# Patient Record
Sex: Male | Born: 1965 | Race: White | Hispanic: No | Marital: Single | Smoking: Never smoker
Health system: Southern US, Community
[De-identification: ages and names within clinical notes are randomized; demographics above are authoritative.]

---

## 2010-12-09 ENCOUNTER — Inpatient Hospital Stay (HOSPITAL_COMMUNITY)
Admission: RE | Admit: 2010-12-09 | Discharge: 2010-12-12 | DRG: 885 | Disposition: A | Discharge status: Home / Self Care | Payer: PRIVATE HEALTH INSURANCE | Source: Ambulatory Visit | Attending: Psychiatry | Admitting: Psychiatry

## 2010-12-09 DIAGNOSIS — F200 Paranoid schizophrenia: Principal | ICD-10-CM

## 2010-12-09 LAB — CBC
HCT: 49 % (ref 39.0–52.0)
Hemoglobin: 16.8 g/dL (ref 13.0–17.0)
MCH: 31.6 pg (ref 26.0–34.0)
MCV: 92.3 fL (ref 78.0–100.0)
RBC: 5.31 MIL/uL (ref 4.22–5.81)
RDW: 12.2 % (ref 11.5–15.5)
WBC: 7.8 10*3/uL (ref 4.0–10.5)

## 2010-12-09 LAB — COMPREHENSIVE METABOLIC PANEL
ALT: 20 U/L (ref 0–53)
AST: 22 U/L (ref 0–37)
Albumin: 3.8 g/dL (ref 3.5–5.2)
Calcium: 9.2 mg/dL (ref 8.4–10.5)
GFR calc Af Amer: >60 mL/min (ref >60)
Sodium: 139 mEq/L (ref 135–145)
Total Protein: 6.4 g/dL (ref 6.0–8.3)

## 2010-12-10 DIAGNOSIS — F29 Unspecified psychosis not due to a substance or known physiological condition: Secondary | ICD-10-CM

## 2010-12-10 NOTE — H&P (Signed)
NAMEDERRIOUS, BOLOGNA              ACCOUNT NO.:  [id number]  MEDICAL RECORD NO.:  [id number]           PATIENT TYPE:  I  LOCATION:  0505                          FACILITY:  [facility]  PHYSICIAN:  Marlis Edelson, DO        DATE OF BIRTH:  08-19-1966  DATE OF ADMISSION:  12/09/2010 DATE OF DISCHARGE:                      PSYCHIATRIC ADMISSION ASSESSMENT   This is a voluntary admission to the services of Dr. Huston Foley.  This 45- year-old single white male presented as a walk in yesterday.  He reported that he was homicidal and that he was experiencing psychosis. He stated that he has had psychosis on and off for about 20 years.  He came in because he was unsure that he might hurt others as he felt that they were calling him names.  He stated that he sees other mouths moving and calling him names such as loser and sick fool.  He believes that he is in a psychic loop of people liking and not liking him.  He also believes that other people think that he is the anti-Christ and Hitler. He denied command hallucinations per se.  He denied being suicidal and he is not currently on any medications.  PAST PSYCHIATRIC HISTORY:  He acknowledges being first diagnosed in the 90s.  This was up in [place] outpatient and recently he has been followed at the [facility].  He said the last antipsychotic they had him on was Seroquel but he did not like how it made him feel so he quit.  He was trying to be seen at [facility] and they referred him over here.  He does report having been committed to [facility] when he was in his 66s by his father.  Other than that, he denies any inpatient admissions.  SOCIAL HISTORY:  He is a high Garment/textile technologist.  He never married.  He has no children.  He is currently employed at [employer]. He is supposed to work Quarry manager and is fearful if he does not show up he will lose his job.  FAMILY HISTORY:  He states his mother was paranoid but she was  never medicated.  ALCOHOL AND DRUG HISTORY:  He states that he smokes marijuana every other day.  He has no primary care physician.  MEDICAL PROBLEMS:  He has none known.  He is not prescribed any medications at present.  LABORATORY STUDIES:  Showed he had no abnormalities of his CBC.  His metabolic profile showed a slightly elevated glucose at 110 and his TSH was normal at 2.918.  The remainder of his physical exam, he has really baggy eyes but he has not been sleeping well.  MENTAL STATUS EXAM:  Today he is alert and oriented.  He is appropriately groomed and dressed in his own clothing.  He appears to be at least adequately nourished.  His speech had no unusual references. He was not thought blocking or responding to internal stimuli.  His thought processes are fairly clear, rational, and goal oriented today. Judgment and insight are fair.  Concentration and memory superficially at least are intact.  Intelligence is average.  He denies being suicidal, he is not homicidal, and he says that the voices are a lot better today.  DIAGNOSES:  AXIS I:  Schizophrenia, paranoid type. AXIS II:  Deferred. AXIS III:  None known. AXIS IV:  Poor support, financial concerns. AXIS V:  40.  The patient was admitted for safety and stabilization.  He was started on Risperdal 0.5 mg p.o. b.i.d. for his psychosis, as well as trazodone 50 mg at h.s. for sleep.  As he was extremely paranoid, a do not admit was done and that can be reevaluated today.  He is requesting discharge so he will not lose his employment and Dr. Huston Foley will address that.     Mickie Leonarda Salon, P.A.-C.   ______________________________ Marlis Edelson, DO    MD/MEDQ  D:  12/10/2010  T:  12/10/2010  Job:  [id number]  Electronically Signed by Jaci Lazier ADAMS P.A.-C. on 12/10/2010 12:40:24 PM Electronically Signed by Marlis Edelson MD on 12/10/2010 07:34:30 PM

## 2010-12-28 NOTE — Discharge Summary (Signed)
  Kent Alvarado, Kent Alvarado              ACCOUNT NO.:  [id number]  MEDICAL RECORD NO.:  [id number]           PATIENT TYPE:  I  LOCATION:  0505                          FACILITY:  [facility]  PHYSICIAN:  Marlis Edelson, DO        DATE OF BIRTH:  1966-06-28  DATE OF ADMISSION:  12/09/2010 DATE OF DISCHARGE:  12/12/2010                              DISCHARGE SUMMARY   REASON FOR ADMISSION:  This was a 45 year old male that presented with symptoms of homicidal thoughts, experiencing psychotic symptoms, having a history of psychosis for about 20 years, was unsure about hurting others as he felt they were calling his name, seeing their mouths move and calling him names, such as loser and "sick fool."  FINAL DIAGNOSES:  Axis I:  Schizophrenia, paranoid type. Axis II:  Deferred. Axis III:  None. Axis IV:  Poor social support, burden of illness, financial issues. Axis V:  Global Assessment of Functioning is current is 50.  LABORATORY DATA:  Showed no abnormalities of the CBC.  Elevated glucose at 110.  TSH was 2.918.  SIGNIFICANT FINDINGS:  This is an alert and oriented patient, appropriately groomed, dressed in own clothing, appeared to be adequately nourished with no unusual references, not thought blocking or responding to internal stimuli.  His thought processes were fairly clear, rational and goal directed.  Judgment and insight were fair. Concentration and memory superficially intact.  Average intelligence. Denied any suicidal or homicidal thoughts and felt that the voices had lessened.  The patient was admitted to the unit for safety and stabilization.  He was started on Risperdal for psychosis and trazodone for sleep.  We contacted the patient's father.  He was discharged on Risperdal 1 mg in the morning and 2 mg at 5 p.m., trazodone 50 mg for sleep.  He had an appointment at the [facility] on Thursday, December 21, 2010.  The patient was advised to return to the emergency room for any  symptoms of suicidal or homicidal thinking or psychotic symptoms. The patient showed no signs of hypomania, mania or psychosis and was discharged.     Landry Corporal, N.P.   ______________________________ Marlis Edelson, DO    JO/MEDQ  D:  12/25/2010  T:  12/25/2010  Job:  [id number]  Electronically Signed by Limmie PatriciaP. on 12/26/2010 02:16:20 PM Electronically Signed by Marlis Edelson MD on 12/26/2010 07:29:07 PM

## 2012-09-27 ENCOUNTER — Ambulatory Visit: Payer: Self-pay | Admitting: Internal Medicine

## 2012-09-27 VITALS — BP 126/74 | HR 80 | Temp 98.2°F | Resp 18 | Ht 72.5 in | Wt 193.0 lb

## 2012-09-27 DIAGNOSIS — IMO0002 Reserved for concepts with insufficient information to code with codable children: Secondary | ICD-10-CM

## 2012-09-27 DIAGNOSIS — R350 Frequency of micturition: Secondary | ICD-10-CM

## 2012-09-27 DIAGNOSIS — N508 Other specified disorders of male genital organs: Secondary | ICD-10-CM

## 2012-09-27 DIAGNOSIS — F200 Paranoid schizophrenia: Secondary | ICD-10-CM

## 2012-09-27 LAB — POCT UA - MICROSCOPIC ONLY
Bacteria, U Microscopic: NEGATIVE
Crystals, Ur, HPF, POC: NEGATIVE
Epithelial cells, urine per micros: NEGATIVE

## 2012-09-27 LAB — POCT URINALYSIS DIPSTICK
Ketones, UA: 15
Spec Grav, UA: 1.02

## 2012-09-27 MED ORDER — ACYCLOVIR 200 MG PO CAPS: 200.0000 mg | ORAL_CAPSULE | Freq: Every day | ORAL | Status: DC

## 2012-09-27 NOTE — Progress Notes (Signed)
  Subjective:    Patient ID: Kent Alvarado, male    DOB: [birth year], 46 y.o.   MRN: [id number]  HPITender lesion on shaft of penis for one day Last sexual partner that was new was 3 years ago No discharge History of urinary frequency for many years including nocturia Often has to go every hour Never has found an etiology Over the last week has seem worse and over the last 2 days has had some dysuria    Review of Systems No fever chills or night sweats No other skin lesions No abdominal pain No prior STDs    Objective:   Physical Exam Vital signs stable Vesicular/pustular lesions on penile shaft that are confluent in 1 cm area R shaft No nodes No d/c  Results for orders placed in visit on 09/27/12  POCT UA - MICROSCOPIC ONLY      Component Value Range   WBC, Ur, HPF, POC 0-1     RBC, urine, microscopic 0-1     Bacteria, U Microscopic neg     Mucus, UA small     Epithelial cells, urine per micros neg     Crystals, Ur, HPF, POC neg     Casts, Ur, LPF, POC neg     Yeast, UA neg    POCT URINALYSIS DIPSTICK      Component Value Range   Color, UA yellow     Clarity, UA clear     Glucose, UA neg     Bilirubin, UA neg     Ketones, UA 15     Spec Grav, UA 1.020     Blood, UA trace-lysed     pH, UA 7.0     Protein, UA trace     Urobilinogen, UA 0.2     Nitrite, UA neg     Leukocytes, UA Negative         Assessment & Plan:  Urinary frequency Genital ulcer  HSV culture Start Zovirax Refer to urology to rule out urethral stricture

## 2012-10-01 ENCOUNTER — Telehealth: Payer: Self-pay

## 2012-10-01 NOTE — Telephone Encounter (Signed)
Please review labs labs

## 2012-10-01 NOTE — Telephone Encounter (Signed)
Pt is looking for lab results   Best number is [phone]

## 2014-11-22 DIAGNOSIS — R351 Nocturia: Secondary | ICD-10-CM | POA: Diagnosis not present

## 2014-11-22 DIAGNOSIS — R350 Frequency of micturition: Secondary | ICD-10-CM | POA: Diagnosis not present

## 2014-11-22 DIAGNOSIS — R3912 Poor urinary stream: Secondary | ICD-10-CM | POA: Diagnosis not present

## 2014-12-13 DIAGNOSIS — F209 Schizophrenia, unspecified: Secondary | ICD-10-CM | POA: Diagnosis not present

## 2014-12-27 DIAGNOSIS — F209 Schizophrenia, unspecified: Secondary | ICD-10-CM | POA: Diagnosis not present

## 2014-12-29 DIAGNOSIS — R350 Frequency of micturition: Secondary | ICD-10-CM | POA: Diagnosis not present

## 2014-12-29 DIAGNOSIS — R3912 Poor urinary stream: Secondary | ICD-10-CM | POA: Diagnosis not present

## 2015-02-07 DIAGNOSIS — R3912 Poor urinary stream: Secondary | ICD-10-CM | POA: Diagnosis not present

## 2015-02-07 DIAGNOSIS — R350 Frequency of micturition: Secondary | ICD-10-CM | POA: Diagnosis not present

## 2015-02-07 DIAGNOSIS — F209 Schizophrenia, unspecified: Secondary | ICD-10-CM | POA: Diagnosis not present

## 2015-02-07 DIAGNOSIS — R351 Nocturia: Secondary | ICD-10-CM | POA: Diagnosis not present

## 2015-03-07 DIAGNOSIS — F209 Schizophrenia, unspecified: Secondary | ICD-10-CM | POA: Diagnosis not present

## 2015-06-07 DIAGNOSIS — F209 Schizophrenia, unspecified: Secondary | ICD-10-CM | POA: Diagnosis not present

## 2015-08-29 DIAGNOSIS — F209 Schizophrenia, unspecified: Secondary | ICD-10-CM | POA: Diagnosis not present

## 2015-11-21 DIAGNOSIS — F209 Schizophrenia, unspecified: Secondary | ICD-10-CM | POA: Diagnosis not present

## 2016-01-26 DIAGNOSIS — F209 Schizophrenia, unspecified: Secondary | ICD-10-CM | POA: Diagnosis not present

## 2016-04-17 DIAGNOSIS — F209 Schizophrenia, unspecified: Secondary | ICD-10-CM | POA: Diagnosis not present

## 2016-07-19 DIAGNOSIS — F209 Schizophrenia, unspecified: Secondary | ICD-10-CM | POA: Diagnosis not present

## 2016-10-11 DIAGNOSIS — F209 Schizophrenia, unspecified: Secondary | ICD-10-CM | POA: Diagnosis not present

## 2017-01-02 DIAGNOSIS — Z79899 Other long term (current) drug therapy: Secondary | ICD-10-CM | POA: Diagnosis not present

## 2017-01-02 DIAGNOSIS — F209 Schizophrenia, unspecified: Secondary | ICD-10-CM | POA: Diagnosis not present

## 2017-01-21 DIAGNOSIS — F209 Schizophrenia, unspecified: Secondary | ICD-10-CM | POA: Diagnosis not present

## 2017-02-14 DIAGNOSIS — F209 Schizophrenia, unspecified: Secondary | ICD-10-CM | POA: Diagnosis not present

## 2017-02-21 DIAGNOSIS — F209 Schizophrenia, unspecified: Secondary | ICD-10-CM | POA: Diagnosis not present

## 2017-03-21 DIAGNOSIS — F209 Schizophrenia, unspecified: Secondary | ICD-10-CM | POA: Diagnosis not present

## 2017-05-01 DIAGNOSIS — F209 Schizophrenia, unspecified: Secondary | ICD-10-CM | POA: Diagnosis not present

## 2017-05-29 DIAGNOSIS — F209 Schizophrenia, unspecified: Secondary | ICD-10-CM | POA: Diagnosis not present

## 2017-06-26 DIAGNOSIS — F209 Schizophrenia, unspecified: Secondary | ICD-10-CM | POA: Diagnosis not present

## 2017-06-27 DIAGNOSIS — F209 Schizophrenia, unspecified: Secondary | ICD-10-CM | POA: Diagnosis not present

## 2017-07-22 DIAGNOSIS — F209 Schizophrenia, unspecified: Secondary | ICD-10-CM | POA: Diagnosis not present

## 2017-07-25 DIAGNOSIS — F209 Schizophrenia, unspecified: Secondary | ICD-10-CM | POA: Diagnosis not present

## 2017-08-21 DIAGNOSIS — F209 Schizophrenia, unspecified: Secondary | ICD-10-CM | POA: Diagnosis not present

## 2017-09-18 DIAGNOSIS — F209 Schizophrenia, unspecified: Secondary | ICD-10-CM | POA: Diagnosis not present

## 2017-10-25 DIAGNOSIS — F209 Schizophrenia, unspecified: Secondary | ICD-10-CM | POA: Diagnosis not present

## 2017-11-13 DIAGNOSIS — F209 Schizophrenia, unspecified: Secondary | ICD-10-CM | POA: Diagnosis not present

## 2017-11-21 DIAGNOSIS — F209 Schizophrenia, unspecified: Secondary | ICD-10-CM | POA: Diagnosis not present

## 2017-12-19 DIAGNOSIS — F209 Schizophrenia, unspecified: Secondary | ICD-10-CM | POA: Diagnosis not present

## 2018-01-16 DIAGNOSIS — F209 Schizophrenia, unspecified: Secondary | ICD-10-CM | POA: Diagnosis not present

## 2018-01-23 DIAGNOSIS — F209 Schizophrenia, unspecified: Secondary | ICD-10-CM | POA: Diagnosis not present

## 2018-02-13 DIAGNOSIS — F209 Schizophrenia, unspecified: Secondary | ICD-10-CM | POA: Diagnosis not present

## 2018-03-13 DIAGNOSIS — F209 Schizophrenia, unspecified: Secondary | ICD-10-CM | POA: Diagnosis not present

## 2018-04-10 DIAGNOSIS — F1211 Cannabis abuse, in remission: Secondary | ICD-10-CM | POA: Diagnosis not present

## 2018-04-10 DIAGNOSIS — F209 Schizophrenia, unspecified: Secondary | ICD-10-CM | POA: Diagnosis not present

## 2018-04-22 DIAGNOSIS — F209 Schizophrenia, unspecified: Secondary | ICD-10-CM | POA: Diagnosis not present

## 2018-04-22 DIAGNOSIS — F1211 Cannabis abuse, in remission: Secondary | ICD-10-CM | POA: Diagnosis not present

## 2018-05-13 DIAGNOSIS — F209 Schizophrenia, unspecified: Secondary | ICD-10-CM | POA: Diagnosis not present

## 2018-05-13 DIAGNOSIS — F1211 Cannabis abuse, in remission: Secondary | ICD-10-CM | POA: Diagnosis not present

## 2018-06-10 DIAGNOSIS — F209 Schizophrenia, unspecified: Secondary | ICD-10-CM | POA: Diagnosis not present

## 2018-06-10 DIAGNOSIS — F1211 Cannabis abuse, in remission: Secondary | ICD-10-CM | POA: Diagnosis not present

## 2018-06-11 ENCOUNTER — Encounter (HOSPITAL_COMMUNITY): Payer: Self-pay

## 2018-06-11 ENCOUNTER — Ambulatory Visit (INDEPENDENT_AMBULATORY_CARE_PROVIDER_SITE_OTHER): Payer: Self-pay

## 2018-06-11 ENCOUNTER — Ambulatory Visit (HOSPITAL_COMMUNITY)
Admission: EM | Admit: 2018-06-11 | Discharge: 2018-06-11 | Disposition: A | Discharge status: Home / Self Care | Payer: Self-pay | Attending: Family Medicine | Admitting: Family Medicine

## 2018-06-11 DIAGNOSIS — L03032 Cellulitis of left toe: Secondary | ICD-10-CM

## 2018-06-11 DIAGNOSIS — Z131 Encounter for screening for diabetes mellitus: Secondary | ICD-10-CM

## 2018-06-11 LAB — GLUCOSE, CAPILLARY: Glucose-Capillary: 155 mg/dL — ABNORMAL HIGH (ref 70–99)

## 2018-06-11 MED ORDER — MUPIROCIN 2 % EX OINT: 1.0000 "application " | TOPICAL_OINTMENT | Freq: Two times a day (BID) | CUTANEOUS | 0 refills | Status: DC

## 2018-06-11 MED ORDER — AMOXICILLIN-POT CLAVULANATE 875-125 MG PO TABS: 1.0000 | ORAL_TABLET | Freq: Two times a day (BID) | ORAL | 0 refills | Status: DC

## 2018-06-11 NOTE — ED Notes (Signed)
Left foot placed in water and betadine to soak as requested by amy yu, pa.

## 2018-06-11 NOTE — ED Provider Notes (Signed)
[facility]    CSN: [id number] Arrival date & time: 06/11/18  [id number]     History   Chief Complaint Chief Complaint  Patient presents with  . Toe Pain    HPI Kent Alvarado is a 52 y.o. male.   52 year old male comes in for toe pain that has been there for at least 1 week. He does not recall how it occurred. When asked about injury, stated "I must have if it looks like that". He has been walking without problems with intermittent pain to the toe. Denies numbness/tingling. Denies fever, chills, night sweats. Has been soaking in epsom salt and states he thinks it looks better than before. Denies history of diabetes.      History reviewed. No pertinent past medical history.  Patient Active Problem List   Diagnosis Date Noted  . Schizophrenia, paranoid (HCC) 09/27/2012    History reviewed. No pertinent surgical history.     Home Medications    Prior to Admission medications   Medication Sig Start Date End Date Taking? Authorizing Provider  acyclovir (ZOVIRAX) 200 MG capsule Take 1 capsule (200 mg total) by mouth 5 (five) times daily. Patient not taking: Reported on 06/11/2018 09/27/12   Tonye Pearsonoolittle, Robert P, MD  amoxicillin-clavulanate (AUGMENTIN) 875-125 MG tablet Take 1 tablet by mouth every 12 (twelve) hours. 06/11/18   Cathie HoopsYu, Amy V, PA-C  mupirocin ointment (BACTROBAN) 2 % Apply 1 application topically 2 (two) times daily. 06/11/18   Cathie HoopsYu, Amy V, PA-C  PE-Dexhlorphen-Pyril-DM (RESPERAL PO) Take 3 mg by mouth daily.    [provider]    Family History Family History  Problem Relation Age of Onset  . Cancer Mother   . CVA Father   . Dementia Maternal Grandmother     Social History Social History   Tobacco Use  . Smoking status: Never Smoker  . Smokeless tobacco: Never Used  Substance Use Topics  . Alcohol use: Not Currently  . Drug use: Not Currently     Allergies   Patient has no known allergies.   Review of Systems Review of Systems    Reason unable to perform ROS: See HPI as above.     Physical Exam Triage Vital Signs ED Triage Vitals [06/11/18 0848]  Enc Vitals Group     BP 138/90     Pulse Rate 74     Resp 18     Temp 98.3 F (36.8 C)     Temp Source Oral     SpO2 98 %     Weight 214 lb (97.1 kg)     Height      Head Circumference      Peak Flow      Pain Score 8     Pain Loc      Pain Edu?      Excl. in GC?    No data found.  Updated Vital Signs BP 138/90   Pulse 74   Temp 98.3 F (36.8 C) (Oral)   Resp 18   Wt 214 lb (97.1 kg)   SpO2 98%   BMI 28.62 kg/m   Physical Exam  Constitutional: He is oriented to person, place, and time. He appears well-developed and well-nourished. No distress.  HENT:  Head: Normocephalic and atraumatic.  Eyes: Pupils are equal, round, and reactive to light. Conjunctivae are normal.  Musculoskeletal:  See picture below. Dried blood vs necrosis. No warmth. No obvious tenderness on palpation. Full ROM of toe. Sensation intact. Pedal  pulse 2+, cap refill, tested on toe pad, <2s.   Neurological: He is alert and oriented to person, place, and time.  Skin: He is not diaphoretic.       UC Treatments / Results  Labs (all labs ordered are listed, but only abnormal results are displayed) Labs Reviewed  GLUCOSE, CAPILLARY - Abnormal; Notable for the following components:      Result Value   Glucose-Capillary 155 (*)    All other components within normal limits    EKG None  Radiology Dg Foot Complete Left  Result Date: 06/11/2018 CLINICAL DATA:  Left first toe swelling after recent injury EXAM: LEFT FOOT - COMPLETE 3+ VIEW COMPARISON:  None. FINDINGS: There is no evidence of fracture or dislocation. Bipartite medial left first metatarsal sesamoid. There is no evidence of arthropathy or other focal bone abnormality. Soft tissues are unremarkable. IMPRESSION: No fracture or malalignment. Electronically Signed   By: Delbert PhenixJason A Poff M.D.   On: 06/11/2018 09:29     Procedures Procedures (including critical care time)  Medications Ordered in UC Medications - No data to display  Initial Impression / Assessment and Plan / UC Course  I have reviewed the triage vital signs and the nursing notes.  Pertinent labs & imaging results that were available during my care of the patient were reviewed by me and considered in my medical decision making (see chart for details).     Xray obtained as patient unsure how he sustained the injury. Xray without acute fractures, osteomyelitis. Wound soaked in normal saline, was able to clean some of the dried blood off the wound. Discussed with patient, avoided aggressive scrubbing today. Wound dressed with bacitracin. Will start patient on augmentin and follow up with podiatry for further management. Return precautions given.  Patient with cbg of 155 fasting, will have patient follow up with PCP for further evaluation. Resources provided. Return precautions given.   Case discussed with Dr Tracie HarrierHagler, who agrees to plan.  Final Clinical Impressions(s) / UC Diagnoses   Final diagnoses:  Cellulitis of toe of left foot    ED Prescriptions    Medication Sig Dispense Auth. Provider   amoxicillin-clavulanate (AUGMENTIN) 875-125 MG tablet Take 1 tablet by mouth every 12 (twelve) hours. 14 tablet Yu, Amy V, PA-C   mupirocin ointment (BACTROBAN) 2 % Apply 1 application topically 2 (two) times daily. 22 g Threasa AlphaYu, Amy V, PA-C        Yu, Amy V, [place]PA-C 06/11/18 1051

## 2018-06-11 NOTE — Discharge Instructions (Addendum)
Start Augmentin as directed. Dress wound daily. You can use bactroban ointment to dress your wound. Continue epsom salt soaks. Clean area gently with soap and water. Please follow up with PCP/podiatry for reevaluation and monitoring for wound healing. If experiencing worsening symptoms, spreading redness, increased warmth, fever, follow up for reevaluation needed.   Your glucose was slightly high for fasting. Please follow up with  and wellness for further evaluation and management needed.

## 2018-06-11 NOTE — ED Triage Notes (Signed)
Toe pain x 1 week or more. Left foot big toe.

## 2018-07-03 ENCOUNTER — Ambulatory Visit: Payer: Self-pay | Attending: Family Medicine | Admitting: Family Medicine

## 2018-07-03 ENCOUNTER — Encounter: Payer: Self-pay | Admitting: Family Medicine

## 2018-07-03 VITALS — BP 110/74 | HR 80 | Temp 98.1°F | Resp 16 | Ht 73.0 in | Wt 217.6 lb

## 2018-07-03 DIAGNOSIS — F200 Paranoid schizophrenia: Secondary | ICD-10-CM | POA: Insufficient documentation

## 2018-07-03 DIAGNOSIS — Z872 Personal history of diseases of the skin and subcutaneous tissue: Secondary | ICD-10-CM | POA: Insufficient documentation

## 2018-07-03 DIAGNOSIS — Z79899 Other long term (current) drug therapy: Secondary | ICD-10-CM | POA: Insufficient documentation

## 2018-07-03 DIAGNOSIS — Z0000 Encounter for general adult medical examination without abnormal findings: Secondary | ICD-10-CM

## 2018-07-03 DIAGNOSIS — Z131 Encounter for screening for diabetes mellitus: Secondary | ICD-10-CM

## 2018-07-03 DIAGNOSIS — Z09 Encounter for follow-up examination after completed treatment for conditions other than malignant neoplasm: Secondary | ICD-10-CM | POA: Insufficient documentation

## 2018-07-03 DIAGNOSIS — L03032 Cellulitis of left toe: Secondary | ICD-10-CM

## 2018-07-03 LAB — POCT GLYCOSYLATED HEMOGLOBIN (HGB A1C): HbA1c, POC (prediabetic range): 5.4 % — AB (ref 5.7–6.4)

## 2018-07-03 MED ORDER — VALBENAZINE TOSYLATE 40 MG PO CAPS: 40.0000 mg | ORAL_CAPSULE | Freq: Every day | ORAL | 0 refills | Status: AC

## 2018-07-03 NOTE — Progress Notes (Signed)
   Kent MarylandRodney Alvarado, is a 52 y.o. male  ZOX:[id number]:[id number]  [id number]  DOB - Jan 03, 1966  Chief Complaint  Patient presents with  . Hospitalization Follow-up    Urgent Care f/u        Subjective:   Kent MarylandRodney Alvarado is a 52 y.o. male here today for a follow up visit.  Was seen 06/11/18  At Urgent Care for L great toe cellulitis placed on Augmentin which he completed. At that visit also a FBS 155 Patient refers no further pain , much improved redness no fever chills  No new sxs noted on history No polyuria dypsia or phagia or sudden wt loss gain  Patient has No headache, No chest pain, No abdominal pain - No Nausea, No new weakness tingling or numbness, No Cough - SOB.  No problems updated.  ALLERGIES: No Known Allergies  PAST MEDICAL HISTORY: No past medical history on file.  MEDICATIONS AT HOME: Prior to Admission medications   Medication Sig Start Date End Date Taking? Authorizing Provider  hydrOXYzine (ATARAX/VISTARIL) 25 MG tablet Take 25 mg by mouth 3 (three) times daily.   Yes [provider]  sertraline (ZOLOFT) 100 MG tablet Take 100 mg by mouth daily.   Yes [provider]  Valbenazine Tosylate (INGREZZA) 40 MG CAPS Take 40 mg by mouth daily. 07/03/18   Deleon, Dereck J, MD     Objective:   Vitals:   07/03/18 1359  BP: 110/74  Pulse: 80  Resp: 16  Temp: 98.1 F (36.7 C)  TempSrc: Oral  SpO2: 97%  Weight: 98.7 kg  Height: 6\' 1"  (1.854 m)    Exam General appearance : Awake, alert, not in any distress. Speech Clear. Not toxic looking  L great toe: much decreased erythema With some granulation tissue lateral nail Sl erythema with clear liquid No pus noted  2+ pedal   <2sec cap refill  Data Review Lab Results  Component Value Date   HGBA1C 5.4 (A) 07/03/2018  5.4 done POC   Assessment & Plan   Patient Active Problem List   Diagnosis Date Noted  . Schizophrenia, paranoid (HCC) 09/27/2012   On meds /has regular follow up and  stable per pt, placed updated med list in chart Problem List Items Addressed This Visit    None    Visit Diagnoses    Screening for diabetes mellitus    -  Primary   Relevant Orders   POCT glycosylated hemoglobin (Hb A1C) (Completed)   Paronychia of great toe of left foot       Relevant Orders   Ambulatory referral to Podiatry   Health care maintenance         -dw pt to repeat DM screening in 6months. -Pt to call Podiatry today given number Warm soaks , keep skin clean Follow up prn if any chgs to L great toe - pt refers doesn't want to do any furhter health maintenance testing including labs, colonoscopy,tetanus or flu among others     Patient have been counseled  about nutrition and exercise   The patient was given clear instructions to go to ER or return to medical center if symptoms don't improve, worsen or new problems develop. The patient verbalized understanding. Lars Masson.    Dereck J DeLeon, MD,  [facility] [place], [place] [phone]   07/03/2018, 3:09 PM

## 2018-07-03 NOTE — Patient Instructions (Signed)
[address], [place], [place] [address]  Hours:  Open ? Closes 5:15PM                          Phone: [phone]

## 2018-07-08 DIAGNOSIS — F209 Schizophrenia, unspecified: Secondary | ICD-10-CM | POA: Diagnosis not present

## 2018-07-08 DIAGNOSIS — F1211 Cannabis abuse, in remission: Secondary | ICD-10-CM | POA: Diagnosis not present

## 2018-07-09 DIAGNOSIS — L03032 Cellulitis of left toe: Secondary | ICD-10-CM | POA: Diagnosis not present

## 2018-07-09 DIAGNOSIS — M79672 Pain in left foot: Secondary | ICD-10-CM | POA: Diagnosis not present

## 2018-07-29 DIAGNOSIS — F1211 Cannabis abuse, in remission: Secondary | ICD-10-CM | POA: Diagnosis not present

## 2018-07-29 DIAGNOSIS — F209 Schizophrenia, unspecified: Secondary | ICD-10-CM | POA: Diagnosis not present

## 2018-08-05 DIAGNOSIS — F1211 Cannabis abuse, in remission: Secondary | ICD-10-CM | POA: Diagnosis not present

## 2018-08-05 DIAGNOSIS — F209 Schizophrenia, unspecified: Secondary | ICD-10-CM | POA: Diagnosis not present

## 2018-08-19 DIAGNOSIS — M79672 Pain in left foot: Secondary | ICD-10-CM | POA: Diagnosis not present

## 2018-08-19 DIAGNOSIS — B351 Tinea unguium: Secondary | ICD-10-CM | POA: Diagnosis not present

## 2018-08-19 DIAGNOSIS — L03032 Cellulitis of left toe: Secondary | ICD-10-CM | POA: Diagnosis not present

## 2018-08-26 DIAGNOSIS — L03032 Cellulitis of left toe: Secondary | ICD-10-CM | POA: Diagnosis not present

## 2018-08-27 DIAGNOSIS — B351 Tinea unguium: Secondary | ICD-10-CM | POA: Diagnosis not present

## 2018-09-02 DIAGNOSIS — F209 Schizophrenia, unspecified: Secondary | ICD-10-CM | POA: Diagnosis not present

## 2018-09-02 DIAGNOSIS — F1211 Cannabis abuse, in remission: Secondary | ICD-10-CM | POA: Diagnosis not present

## 2018-10-08 DIAGNOSIS — F209 Schizophrenia, unspecified: Secondary | ICD-10-CM | POA: Diagnosis not present

## 2018-10-08 DIAGNOSIS — F1211 Cannabis abuse, in remission: Secondary | ICD-10-CM | POA: Diagnosis not present

## 2018-10-30 DIAGNOSIS — F1211 Cannabis abuse, in remission: Secondary | ICD-10-CM | POA: Diagnosis not present

## 2018-10-30 DIAGNOSIS — F209 Schizophrenia, unspecified: Secondary | ICD-10-CM | POA: Diagnosis not present

## 2018-11-20 DIAGNOSIS — F209 Schizophrenia, unspecified: Secondary | ICD-10-CM | POA: Diagnosis not present

## 2018-11-20 DIAGNOSIS — F1211 Cannabis abuse, in remission: Secondary | ICD-10-CM | POA: Diagnosis not present

## 2018-11-21 DIAGNOSIS — F209 Schizophrenia, unspecified: Secondary | ICD-10-CM | POA: Diagnosis not present

## 2018-11-21 DIAGNOSIS — F1211 Cannabis abuse, in remission: Secondary | ICD-10-CM | POA: Diagnosis not present

## 2019-01-05 DIAGNOSIS — F209 Schizophrenia, unspecified: Secondary | ICD-10-CM | POA: Diagnosis not present

## 2019-01-05 DIAGNOSIS — F1211 Cannabis abuse, in remission: Secondary | ICD-10-CM | POA: Diagnosis not present

## 2019-01-06 DIAGNOSIS — F1211 Cannabis abuse, in remission: Secondary | ICD-10-CM | POA: Diagnosis not present

## 2019-01-06 DIAGNOSIS — F209 Schizophrenia, unspecified: Secondary | ICD-10-CM | POA: Diagnosis not present

## 2019-02-10 DIAGNOSIS — F1211 Cannabis abuse, in remission: Secondary | ICD-10-CM | POA: Diagnosis not present

## 2019-02-10 DIAGNOSIS — F209 Schizophrenia, unspecified: Secondary | ICD-10-CM | POA: Diagnosis not present

## 2019-03-19 DIAGNOSIS — F1211 Cannabis abuse, in remission: Secondary | ICD-10-CM | POA: Diagnosis not present

## 2019-03-19 DIAGNOSIS — F209 Schizophrenia, unspecified: Secondary | ICD-10-CM | POA: Diagnosis not present

## 2019-04-27 IMAGING — DX DG FOOT COMPLETE 3+V*L*
3 series · 3 of 3 positions shown · non-contrast
Comparison: None.

CLINICAL DATA: Left first toe swelling after recent injury

EXAM:
LEFT FOOT - COMPLETE 3+ VIEW

[foot ap]
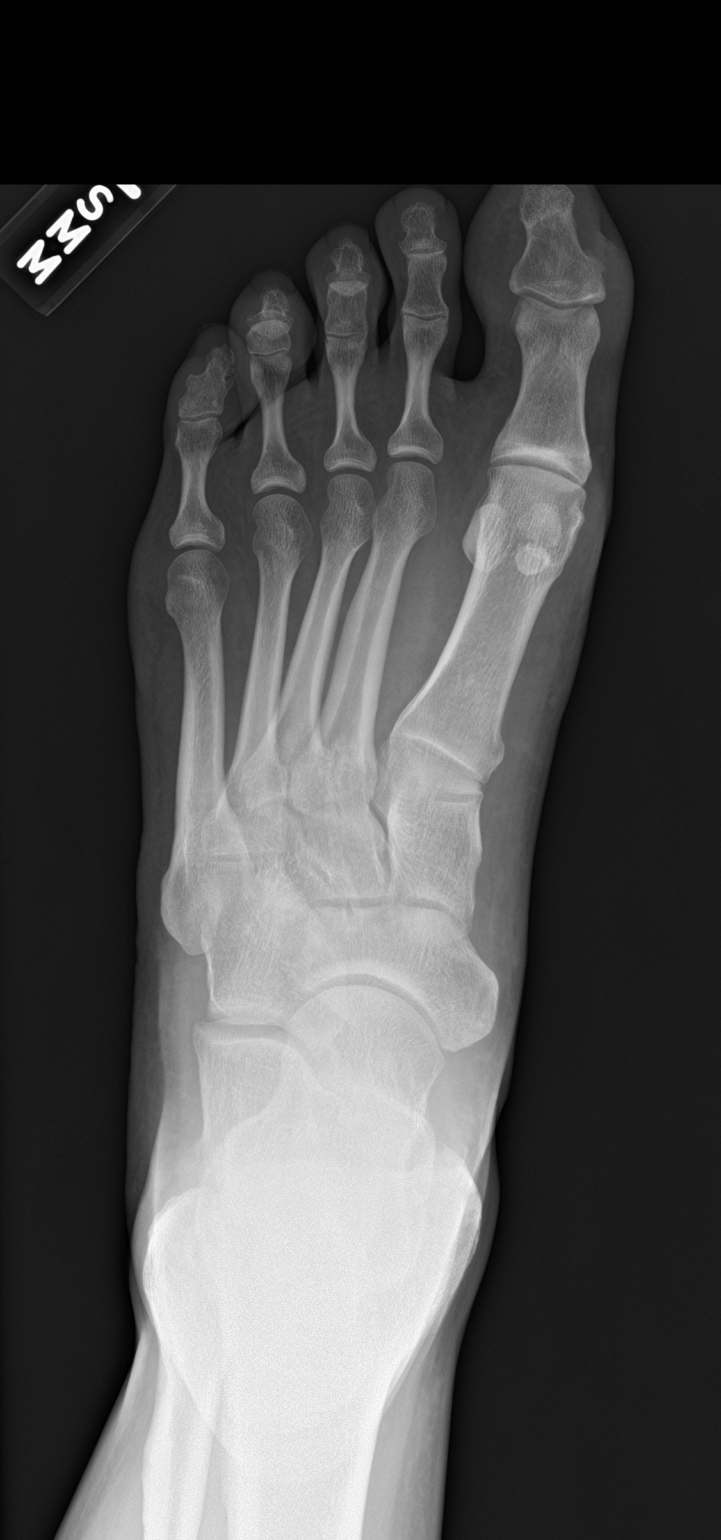

[foot obl]
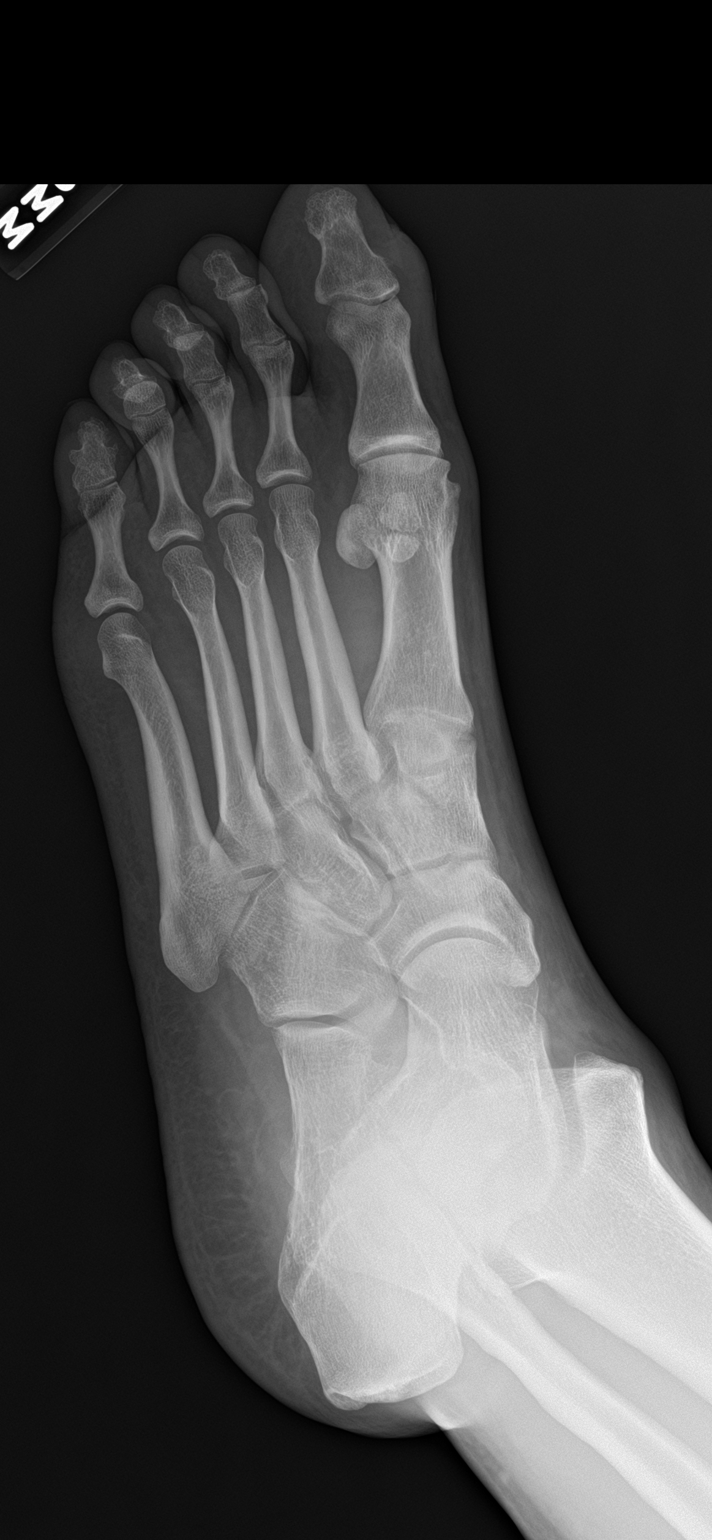

[foot lat]
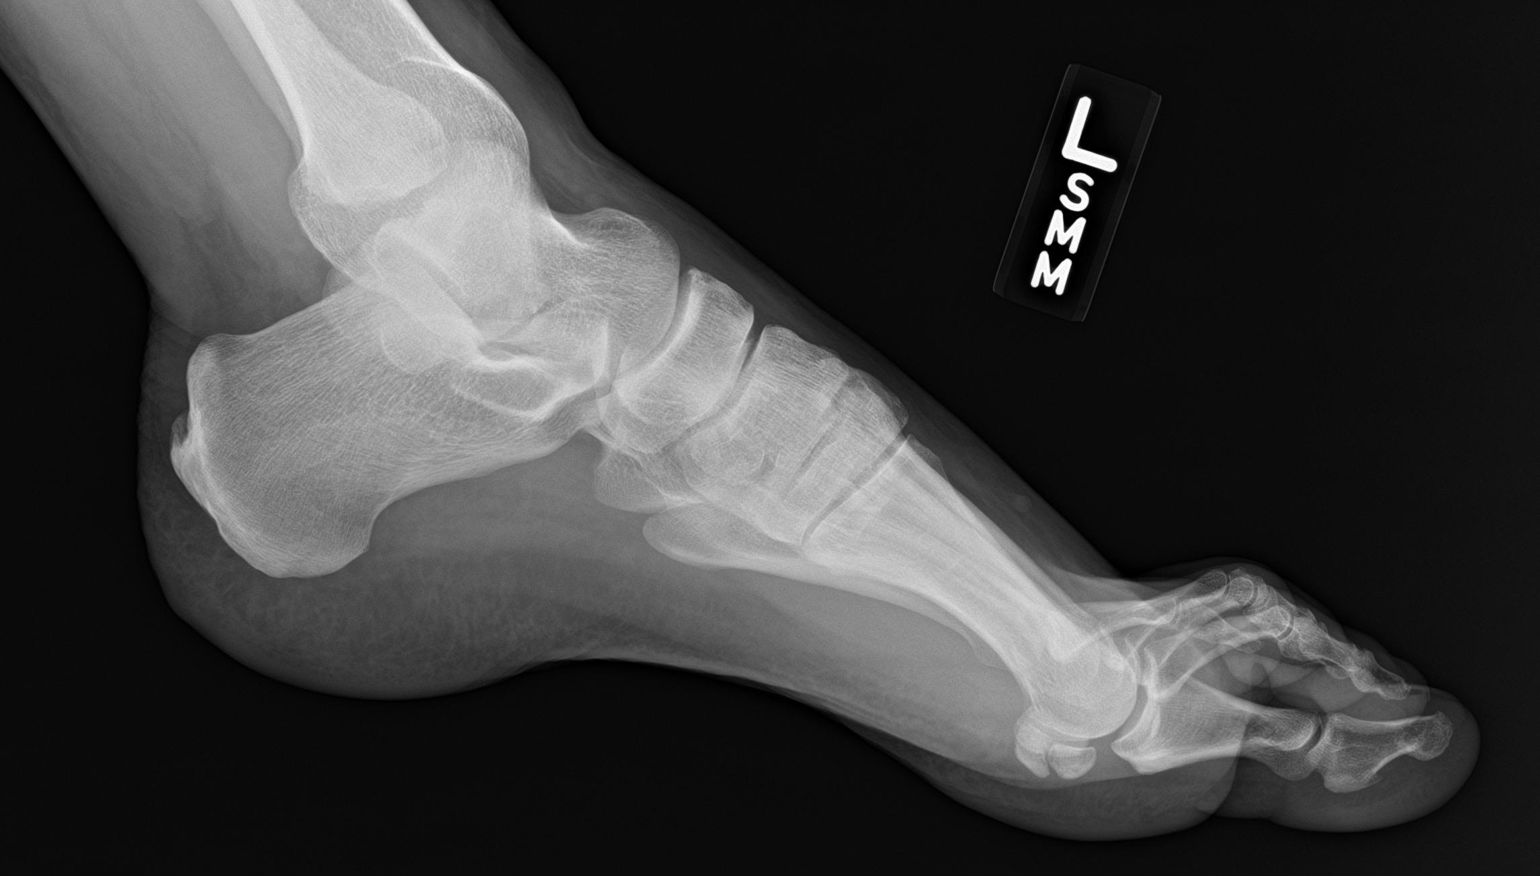

[3 of 3 positions shown; findings below may reference images not displayed]

FINDINGS: There is no evidence of fracture or dislocation. Bipartite medial
left first metatarsal sesamoid. There is no evidence of arthropathy
or other focal bone abnormality. Soft tissues are unremarkable.
IMPRESSION: No fracture or malalignment.

## 2019-04-30 DIAGNOSIS — F209 Schizophrenia, unspecified: Secondary | ICD-10-CM | POA: Diagnosis not present

## 2019-05-28 DIAGNOSIS — F209 Schizophrenia, unspecified: Secondary | ICD-10-CM | POA: Diagnosis not present

## 2019-06-11 DIAGNOSIS — F209 Schizophrenia, unspecified: Secondary | ICD-10-CM | POA: Diagnosis not present

## 2019-06-11 DIAGNOSIS — F1211 Cannabis abuse, in remission: Secondary | ICD-10-CM | POA: Diagnosis not present

## 2019-07-02 DIAGNOSIS — F209 Schizophrenia, unspecified: Secondary | ICD-10-CM | POA: Diagnosis not present

## 2019-09-03 DIAGNOSIS — F1211 Cannabis abuse, in remission: Secondary | ICD-10-CM | POA: Diagnosis not present

## 2019-09-03 DIAGNOSIS — F209 Schizophrenia, unspecified: Secondary | ICD-10-CM | POA: Diagnosis not present

## 2019-09-03 DIAGNOSIS — F339 Major depressive disorder, recurrent, unspecified: Secondary | ICD-10-CM | POA: Diagnosis not present

## 2019-11-03 DIAGNOSIS — F339 Major depressive disorder, recurrent, unspecified: Secondary | ICD-10-CM | POA: Diagnosis not present

## 2019-11-03 DIAGNOSIS — F209 Schizophrenia, unspecified: Secondary | ICD-10-CM | POA: Diagnosis not present

## 2019-11-03 DIAGNOSIS — F1211 Cannabis abuse, in remission: Secondary | ICD-10-CM | POA: Diagnosis not present

## 2019-11-03 DIAGNOSIS — F411 Generalized anxiety disorder: Secondary | ICD-10-CM | POA: Diagnosis not present

## 2019-11-19 DIAGNOSIS — F209 Schizophrenia, unspecified: Secondary | ICD-10-CM | POA: Diagnosis not present

## 2019-11-26 DIAGNOSIS — F209 Schizophrenia, unspecified: Secondary | ICD-10-CM | POA: Diagnosis not present

## 2019-11-26 DIAGNOSIS — F1211 Cannabis abuse, in remission: Secondary | ICD-10-CM | POA: Diagnosis not present

## 2019-11-26 DIAGNOSIS — F411 Generalized anxiety disorder: Secondary | ICD-10-CM | POA: Diagnosis not present

## 2019-11-26 DIAGNOSIS — F17221 Nicotine dependence, chewing tobacco, in remission: Secondary | ICD-10-CM | POA: Diagnosis not present

## 2019-11-26 DIAGNOSIS — F332 Major depressive disorder, recurrent severe without psychotic features: Secondary | ICD-10-CM | POA: Diagnosis not present

## 2019-11-26 DIAGNOSIS — F4310 Post-traumatic stress disorder, unspecified: Secondary | ICD-10-CM | POA: Diagnosis not present

## 2019-11-26 DIAGNOSIS — F339 Major depressive disorder, recurrent, unspecified: Secondary | ICD-10-CM | POA: Diagnosis not present

## 2019-11-26 DIAGNOSIS — F603 Borderline personality disorder: Secondary | ICD-10-CM | POA: Diagnosis not present

## 2019-12-15 DIAGNOSIS — F209 Schizophrenia, unspecified: Secondary | ICD-10-CM | POA: Diagnosis not present

## 2019-12-29 DIAGNOSIS — F411 Generalized anxiety disorder: Secondary | ICD-10-CM | POA: Diagnosis not present

## 2019-12-29 DIAGNOSIS — F1211 Cannabis abuse, in remission: Secondary | ICD-10-CM | POA: Diagnosis not present

## 2019-12-29 DIAGNOSIS — F339 Major depressive disorder, recurrent, unspecified: Secondary | ICD-10-CM | POA: Diagnosis not present

## 2019-12-29 DIAGNOSIS — F17221 Nicotine dependence, chewing tobacco, in remission: Secondary | ICD-10-CM | POA: Diagnosis not present

## 2019-12-29 DIAGNOSIS — F209 Schizophrenia, unspecified: Secondary | ICD-10-CM | POA: Diagnosis not present

## 2020-01-01 DIAGNOSIS — Z23 Encounter for immunization: Secondary | ICD-10-CM | POA: Diagnosis not present

## 2020-01-12 DIAGNOSIS — F209 Schizophrenia, unspecified: Secondary | ICD-10-CM | POA: Diagnosis not present

## 2020-01-27 DIAGNOSIS — Z23 Encounter for immunization: Secondary | ICD-10-CM | POA: Diagnosis not present

## 2020-02-18 DIAGNOSIS — F209 Schizophrenia, unspecified: Secondary | ICD-10-CM | POA: Diagnosis not present

## 2020-03-10 DIAGNOSIS — F209 Schizophrenia, unspecified: Secondary | ICD-10-CM | POA: Diagnosis not present

## 2020-04-28 DIAGNOSIS — F1211 Cannabis abuse, in remission: Secondary | ICD-10-CM | POA: Diagnosis not present

## 2020-04-28 DIAGNOSIS — F17221 Nicotine dependence, chewing tobacco, in remission: Secondary | ICD-10-CM | POA: Diagnosis not present

## 2020-04-28 DIAGNOSIS — F339 Major depressive disorder, recurrent, unspecified: Secondary | ICD-10-CM | POA: Diagnosis not present

## 2020-04-28 DIAGNOSIS — F411 Generalized anxiety disorder: Secondary | ICD-10-CM | POA: Diagnosis not present

## 2020-04-28 DIAGNOSIS — F209 Schizophrenia, unspecified: Secondary | ICD-10-CM | POA: Diagnosis not present

## 2020-08-16 DIAGNOSIS — F209 Schizophrenia, unspecified: Secondary | ICD-10-CM | POA: Diagnosis not present

## 2020-08-16 DIAGNOSIS — F17221 Nicotine dependence, chewing tobacco, in remission: Secondary | ICD-10-CM | POA: Diagnosis not present

## 2020-08-16 DIAGNOSIS — F411 Generalized anxiety disorder: Secondary | ICD-10-CM | POA: Diagnosis not present

## 2020-08-16 DIAGNOSIS — F339 Major depressive disorder, recurrent, unspecified: Secondary | ICD-10-CM | POA: Diagnosis not present

## 2020-08-16 DIAGNOSIS — F1211 Cannabis abuse, in remission: Secondary | ICD-10-CM | POA: Diagnosis not present

## 2020-09-13 DIAGNOSIS — F209 Schizophrenia, unspecified: Secondary | ICD-10-CM | POA: Diagnosis not present

## 2020-09-19 DIAGNOSIS — F411 Generalized anxiety disorder: Secondary | ICD-10-CM | POA: Diagnosis not present

## 2020-09-19 DIAGNOSIS — F339 Major depressive disorder, recurrent, unspecified: Secondary | ICD-10-CM | POA: Diagnosis not present

## 2020-09-19 DIAGNOSIS — F1211 Cannabis abuse, in remission: Secondary | ICD-10-CM | POA: Diagnosis not present

## 2020-09-19 DIAGNOSIS — F209 Schizophrenia, unspecified: Secondary | ICD-10-CM | POA: Diagnosis not present

## 2020-09-19 DIAGNOSIS — F17221 Nicotine dependence, chewing tobacco, in remission: Secondary | ICD-10-CM | POA: Diagnosis not present
